# Patient Record
Sex: Female | Born: 1990 | Race: Black or African American | Hispanic: No | Marital: Single | Smoking: Never smoker
Health system: Southern US, Community
[De-identification: ages and names within clinical notes are randomized; demographics above are authoritative.]

## PROBLEM LIST (undated history)

## (undated) DIAGNOSIS — F32A Depression, unspecified: Secondary | ICD-10-CM

## (undated) DIAGNOSIS — G5600 Carpal tunnel syndrome, unspecified upper limb: Secondary | ICD-10-CM

## (undated) DIAGNOSIS — F329 Major depressive disorder, single episode, unspecified: Secondary | ICD-10-CM

---
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
## 2012-03-02 ENCOUNTER — Emergency Department (HOSPITAL_COMMUNITY)
Admission: EM | Admit: 2012-03-02 | Discharge: 2012-03-02 | Disposition: A | Discharge status: Home / Self Care | Payer: Self-pay | Attending: Emergency Medicine | Admitting: Emergency Medicine

## 2012-03-02 ENCOUNTER — Encounter (HOSPITAL_COMMUNITY): Payer: Self-pay | Admitting: Nurse Practitioner

## 2012-03-02 DIAGNOSIS — M79609 Pain in unspecified limb: Secondary | ICD-10-CM | POA: Insufficient documentation

## 2012-03-02 DIAGNOSIS — M79641 Pain in right hand: Secondary | ICD-10-CM

## 2012-03-02 DIAGNOSIS — M25549 Pain in joints of unspecified hand: Secondary | ICD-10-CM | POA: Insufficient documentation

## 2012-03-02 HISTORY — DX: Carpal tunnel syndrome, unspecified upper limb: G56.00

## 2012-03-02 MED ORDER — HYDROCODONE-ACETAMINOPHEN 5-500 MG PO TABS: 1.0000 | ORAL_TABLET | Freq: Four times a day (QID) | ORAL | Status: AC | PRN

## 2012-03-02 MED ORDER — PREDNISONE 20 MG PO TABS: 40.0000 mg | ORAL_TABLET | Freq: Every day | ORAL | Status: DC

## 2012-03-02 NOTE — ED Provider Notes (Signed)
Medical screening examination/treatment/procedure(s) were performed by non-physician practitioner and as supervising physician I was immediately available for consultation/collaboration.   Dakiya Puopolo, MD 03/02/12 1707 

## 2012-03-02 NOTE — ED Notes (Signed)
Pt c/o "shooting pains" from hands up arms over past 3 weeks. Seen at [facility] and dx with carpal tunnel started on naproxen with no pain relief. States she cant even work due to pain. Cms intact

## 2012-03-02 NOTE — ED Provider Notes (Signed)
History     CSN: [id number]  Arrival date & time 03/02/12  7829   First MD Initiated Contact with Patient 03/02/12 0914      Chief Complaint  Patient presents with  . Extremity Pain    (Consider location/radiation/quality/duration/timing/severity/associated sxs/prior treatment) Patient is a 21 y.o. female presenting with extremity pain. The history is provided by the patient and a parent.  Extremity Pain This is a new problem. The current episode started 1 to 4 weeks ago. The problem occurs constantly. The problem has been gradually worsening. Associated symptoms include arthralgias and numbness. Pertinent negatives include no chills, fever, joint swelling, neck pain, rash or weakness. The symptoms are aggravated by bending. She has tried NSAIDs and immobilization for the symptoms. The treatment provided no relief.  Pt reports onset of right hand pain about 3 weeks ago, at first only at night, waking her up from sleep, but now throughout the day too. States pain mainly in palm of the right hand, radiating into right middle finger and up the arm into right shoulder. States also has mild pain in the left hand, as well, but nothing as bad. Denies swelling of the hand of wrist. Denies redness. Denies injury. Denies fever, chills, malaise. Denies neck pain or stiffness. Denies weakness in hands.  Past Medical History  Diagnosis Date  . Carpal tunnel syndrome     History reviewed. No pertinent past surgical history.  History reviewed. No pertinent family history.  History  Substance Use Topics  . Smoking status: Never Smoker   . Smokeless tobacco: Not on file  . Alcohol Use: No    OB History    Grav Para Term Preterm Abortions TAB SAB Ect Mult Living                  Review of Systems  Constitutional: Negative for fever and chills.  HENT: Negative for neck pain.   Eyes: Negative.   Respiratory: Negative.   Cardiovascular: Negative.   Gastrointestinal: Negative.     Genitourinary: Negative.   Musculoskeletal: Positive for arthralgias. Negative for joint swelling.  Skin: Negative for rash.  Neurological: Positive for numbness. Negative for weakness.  Psychiatric/Behavioral: Negative.     Allergies  Review of patient's allergies indicates no known allergies.  Home Medications  No current outpatient prescriptions on file.  BP 129/85  Pulse 82  Temp(Src) 97.5 F (36.4 C) (Oral)  Resp 18  Ht 5\' 6"  (1.676 m)  Wt 280 lb (127.007 kg)  BMI 45.19 kg/m2  SpO2 99%  Physical Exam  Nursing note and vitals reviewed. Constitutional: She is oriented to person, place, and time. She appears well-developed and well-nourished. No distress.  HENT:  Head: Normocephalic.  Eyes: Conjunctivae are normal.  Neck: Normal range of motion. Neck supple.  Cardiovascular: Normal rate, regular rhythm and normal heart sounds.   Pulmonary/Chest: Effort normal and breath sounds normal.  Musculoskeletal: She exhibits tenderness. She exhibits no edema.       Full ROM of the neck, no tenderness with palpation of midline c-spine. Normal appearing right shoulder, right elbow with full rom. Normal appearing right hand. Tender with palpation over anterior right middle finger, and over 2nd and 3rd palmar surfaces of the MCP joints. Pain with Wrist flexion, and with flexion of all finger. Full ROM of all fingers, no tenderness directly over flexor tendons. Good strength with flexion and extension of all fingers against resistance. Good right radial and ulnar pulses  Neurological: She is alert and oriented  to person, place, and time.  Skin: Skin is warm and dry.  Psychiatric: She has a normal mood and affect.    ED Course  Procedures (including critical care time)  Suspect neuropathic pain in right hand. Pt already seen at [facility] and diagnosed with carpal tunnel, pt has a splint, taking naprosyn, states pain not controlled. Unable to go to work today. suspct pt's pain either due to  carpal tunnel or other source median/radial nerve impingement. No weakness noted on todays exam. Will continue splint. Refer to hand  No diagnosis found.    MDM         Lottie Mussel, PA 03/02/12 [phone]

## 2012-03-02 NOTE — Discharge Instructions (Signed)
I suspect your pain is caused by an impingement of one of the nerves in your hand. It is important that if this pain continues, you follow up with a hand specialist who can do more tests and treat your symptoms. For now, continue wearing the brace, day and night. No lifting with right hand. Continue naprosyn. Take prednisone for 5 days as prescribed. Take vicodin as prescribed as needed for severe pain only, do not drive if taking this medication.   Carpal Tunnel Syndrome The carpal tunnel is a narrow hollow area in the wrist. It is formed by the wrist bones and ligaments. Nerves, blood vessels, and tendons (cord like structures which attach muscle to bone) on the palm side (the side of your hand in the direction your fingers bend) of your hand pass through the carpal tunnel. Repeated wrist motion or certain diseases may cause swelling within the tunnel. (That is why these are called repetitive trauma (damage caused by over use) disorders. It is also a common problem in late pregnancy.) This swelling pinches the main nerve in the wrist (median nerve) and causes the painful condition called carpal tunnel syndrome. A feeling of "pins and needles" may be noticed in the fingers or hand; however, the entire arm may ache from this condition. Carpal tunnel syndrome may clear up by itself. Cortisone injections may help. Sometimes, an operation may be needed to free the pinched nerve. An electromyogram (a type of test) may be needed to confirm this diagnosis (learning what is wrong). This is a test which measures nerve conduction. The nerve conduction is usually slowed in a carpal tunnel syndrome. HOME CARE INSTRUCTIONS   If your caregiver prescribed medication to help reduce swelling, take as directed.   If you were given a splint to keep your wrist from bending, use it as instructed. It is important to wear the splint at night. Use the splint for as long as you have pain or numbness in your hand, arm or wrist. This  may take 1 to 2 months.   If you have pain at night, it may help to rub or shake your hand, or elevate your hand above the level of your heart (the center of your chest).   It is important to give your wrist a rest by stopping the activities that are causing the problem. If your symptoms (problems) are work-related, you may need to talk to your employer about changing to a job that does not require using your wrist.   Only take over-the-counter or prescription medicines for pain, discomfort, or fever as directed by your caregiver.   Following periods of extended use, particularly strenuous use, apply an ice pack wrapped in a towel to the anterior (palm) side of the affected wrist for 20 to 30 minutes. Repeat as needed three to four times per day. This will help reduce the swelling.   Follow all instructions for follow-up with your caregiver. This includes any orthopedic referrals, physical therapy, and rehabilitation. Any delay in obtaining necessary care could result in a delay or failure of your condition to heal.  SEEK IMMEDIATE MEDICAL CARE IF:   You are still having pain and numbness following a week of treatment.   You develop new, unexplained symptoms.   Your current symptoms are getting worse and are not helped or controlled with medications.  MAKE SURE YOU:   Understand these instructions.   Will watch your condition.   Will get help right away if you are not doing well  or get worse.  Document Released: 12/03/2000 Document Revised: 11/25/2011 Document Reviewed: 10/22/2011 [facility] Patient Information 2012 [place], [place].

## 2012-03-02 NOTE — ED Notes (Signed)
Pt given note for work, works at [employer] is [employer].

## 2015-03-19 ENCOUNTER — Encounter (HOSPITAL_BASED_OUTPATIENT_CLINIC_OR_DEPARTMENT_OTHER): Payer: Self-pay

## 2015-03-19 ENCOUNTER — Emergency Department (HOSPITAL_BASED_OUTPATIENT_CLINIC_OR_DEPARTMENT_OTHER)
Admission: EM | Admit: 2015-03-19 | Discharge: 2015-03-19 | Disposition: A | Discharge status: Home / Self Care | Payer: Self-pay | Attending: Emergency Medicine | Admitting: Emergency Medicine

## 2015-03-19 ENCOUNTER — Other Ambulatory Visit: Payer: Self-pay

## 2015-03-19 DIAGNOSIS — R42 Dizziness and giddiness: Secondary | ICD-10-CM | POA: Insufficient documentation

## 2015-03-19 DIAGNOSIS — Z8669 Personal history of other diseases of the nervous system and sense organs: Secondary | ICD-10-CM | POA: Insufficient documentation

## 2015-03-19 LAB — BASIC METABOLIC PANEL
Anion gap: 8 (ref 5–15)
BUN: 14 mg/dL (ref 6–23)
CHLORIDE: 106 mmol/L (ref 96–112)
CO2: 25 mmol/L (ref 19–32)
CREATININE: 0.88 mg/dL (ref 0.50–1.10)
Calcium: 9.3 mg/dL (ref 8.4–10.5)
GFR calc Af Amer: >90 mL/min (ref >90)
GLUCOSE: 109 mg/dL — AB (ref 70–99)
POTASSIUM: 3.8 mmol/L (ref 3.5–5.1)
SODIUM: 139 mmol/L (ref 135–145)

## 2015-03-19 LAB — CBC
HEMATOCRIT: 37.6 % (ref 36.0–46.0)
HEMOGLOBIN: 11.8 g/dL — AB (ref 12.0–15.0)
MCH: 23.3 pg — ABNORMAL LOW (ref 26.0–34.0)
MCHC: 31.4 g/dL (ref 30.0–36.0)
MCV: 74.3 fL — ABNORMAL LOW (ref 78.0–100.0)
Platelets: 376 10*3/uL (ref 150–400)
RBC: 5.06 MIL/uL (ref 3.87–5.11)
RDW: 18.4 % — ABNORMAL HIGH (ref 11.5–15.5)
WBC: 9.2 10*3/uL (ref 4.0–10.5)

## 2015-03-19 MED ORDER — IBUPROFEN 800 MG PO TABS
800.0000 mg | ORAL_TABLET | Freq: Once | ORAL | Status: AC
  Administered 2015-03-19: 800 mg via ORAL
  Filled 2015-03-19: qty 1

## 2015-03-19 MED ORDER — MECLIZINE HCL 12.5 MG PO TABS: 12.5000 mg | ORAL_TABLET | Freq: Three times a day (TID) | ORAL | Status: AC | PRN

## 2015-03-19 NOTE — Discharge Instructions (Signed)
Benign Positional Vertigo °Vertigo means you feel like you or your surroundings are moving when they are not. Benign positional vertigo is the most common form of vertigo. Benign means that the cause of your condition is not serious. Benign positional vertigo is more common in older adults. °CAUSES  °Benign positional vertigo is the result of an upset in the labyrinth system. This is an area in the middle ear that helps control your balance. This may be caused by a viral infection, head injury, or repetitive motion. However, often no specific cause is found. °SYMPTOMS  °Symptoms of benign positional vertigo occur when you move your head or eyes in different directions. Some of the symptoms may include: °· Loss of balance and falls. °· Vomiting. °· Blurred vision. °· Dizziness. °· Nausea. °· Involuntary eye movements (nystagmus). °DIAGNOSIS  °Benign positional vertigo is usually diagnosed by physical exam. If the specific cause of your benign positional vertigo is unknown, your caregiver may perform imaging tests, such as magnetic resonance imaging (MRI) or computed tomography (CT). °TREATMENT  °Your caregiver may recommend movements or procedures to correct the benign positional vertigo. Medicines such as meclizine, benzodiazepines, and medicines for nausea may be used to treat your symptoms. In rare cases, if your symptoms are caused by certain conditions that affect the inner ear, you may need surgery. °HOME CARE INSTRUCTIONS  °· Follow your caregiver's instructions. °· Move slowly. Do not make sudden body or head movements. °· Avoid driving. °· Avoid operating heavy machinery. °· Avoid performing any tasks that would be dangerous to you or others during a vertigo episode. °· Drink enough fluids to keep your urine clear or pale yellow. °SEEK IMMEDIATE MEDICAL CARE IF:  °· You develop problems with walking, weakness, numbness, or using your arms, hands, or legs. °· You have difficulty speaking. °· You develop  severe headaches. °· Your nausea or vomiting continues or gets worse. °· You develop visual changes. °· Your family or friends notice any behavioral changes. °· Your condition gets worse. °· You have a fever. °· You develop a stiff neck or sensitivity to light. °MAKE SURE YOU:  °· Understand these instructions. °· Will watch your condition. °· Will get help right away if you are not doing well or get worse. °Document Released: 09/13/2006 Document Revised: 02/28/2012 Document Reviewed: 08/26/2011 °ExitCare® Patient Information ©2015 [employer]. This information is not intended to replace advice given to you by your health care provider. Make sure you discuss any questions you have with your health care provider. ° °Dizziness °Dizziness is a common problem. It is a feeling of unsteadiness or light-headedness. You may feel like you are about to faint. Dizziness can lead to injury if you stumble or fall. A person of any age group can suffer from dizziness, but dizziness is more common in older adults. °CAUSES  °Dizziness can be caused by many different things, including: °· Middle ear problems. °· Standing for too long. °· Infections. °· An allergic reaction. °· Aging. °· An emotional response to something, such as the sight of blood. °· Side effects of medicines. °· Tiredness. °· Problems with circulation or blood pressure. °· Excessive use of alcohol or medicines, or illegal drug use. °· Breathing too fast (hyperventilation). °· An irregular heart rhythm (arrhythmia). °· A low red blood cell count (anemia). °· Pregnancy. °· Vomiting, diarrhea, fever, or other illnesses that cause body fluid loss (dehydration). °· Diseases or conditions such as Parkinson's disease, high blood pressure (hypertension), diabetes, and thyroid problems. °·   Exposure to extreme heat. DIAGNOSIS  Your health care provider will ask about your symptoms, perform a physical exam, and perform an electrocardiogram (ECG) to record the electrical  activity of your heart. Your health care provider may also perform other heart or blood tests to determine the cause of your dizziness. These may include:  Transthoracic echocardiogram (TTE). During echocardiography, sound waves are used to evaluate how blood flows through your heart.  Transesophageal echocardiogram (TEE).  Cardiac monitoring. This allows your health care provider to monitor your heart rate and rhythm in real time.  Holter monitor. This is a portable device that records your heartbeat and can help diagnose heart arrhythmias. It allows your health care provider to track your heart activity for several days if needed.  Stress tests by exercise or by giving medicine that makes the heart beat faster. TREATMENT  Treatment of dizziness depends on the cause of your symptoms and can vary greatly. HOME CARE INSTRUCTIONS   Drink enough fluids to keep your urine clear or pale yellow. This is especially important in very hot weather. In older adults, it is also important in cold weather.  Take your medicine exactly as directed if your dizziness is caused by medicines. When taking blood pressure medicines, it is especially important to get up slowly.  Rise slowly from chairs and steady yourself until you feel okay.  In the morning, first sit up on the side of the bed. When you feel okay, stand slowly while holding onto something until you know your balance is fine.  Move your legs often if you need to stand in one place for a long time. Tighten and relax your muscles in your legs while standing.  Have someone stay with you for 1-2 days if dizziness continues to be a problem. Do this until you feel you are well enough to stay alone. Have the person call your health care provider if he or she notices changes in you that are concerning.  Do not drive or use heavy machinery if you feel dizzy.  Do not drink alcohol. SEEK IMMEDIATE MEDICAL CARE IF:   Your dizziness or light-headedness  gets worse.  You feel nauseous or vomit.  You have problems talking, walking, or using your arms, hands, or legs.  You feel weak.  You are not thinking clearly or you have trouble forming sentences. It may take a friend or family member to notice this.  You have chest pain, abdominal pain, shortness of breath, or sweating.  Your vision changes.  You notice any bleeding.  You have side effects from medicine that seems to be getting worse rather than better. MAKE SURE YOU:   Understand these instructions.  Will watch your condition.  Will get help right away if you are not doing well or get worse. Document Released: 06/01/2001 Document Revised: 12/11/2013 Document Reviewed: 06/25/2011 [facility] Patient Information 2015 [place], [place]. This information is not intended to replace advice given to you by your health care provider. Make sure you discuss any questions you have with your health care provider.  You were evaluated in the ED today for your dizziness. There is not appear to be an emergent cause. Symptoms at this time. It is important for you to take your medications as prescribed. It is also important for you to follow-up with primary care for further evaluation and management of your symptoms. Return to ED for new or worsening symptoms.   Emergency Department Resource Guide 1) Find a Doctor and Pay Out of  Pocket Although you won't have to find out who is covered by your insurance plan, it is a good idea to ask around and get recommendations. You will then need to call the office and see if the doctor you have chosen will accept you as a new patient and what types of options they offer for patients who are self-pay. Some doctors offer discounts or will set up payment plans for their patients who do not have insurance, but you will need to ask so you aren't surprised when you get to your appointment.  2) Contact Your Local Health Department Not all health departments have  doctors that can see patients for sick visits, but many do, so it is worth a call to see if yours does. If you don't know where your local health department is, you can check in your phone book. The CDC also has a tool to help you locate your state's health department, and many state websites also have listings of all of their local health departments.  3) Find a Walk-in Clinic If your illness is not likely to be very severe or complicated, you may want to try a walk in clinic. These are popping up all over the country in pharmacies, drugstores, and shopping centers. They're usually staffed by nurse practitioners or physician assistants that have been trained to treat common illnesses and complaints. They're usually fairly quick and inexpensive. However, if you have serious medical issues or chronic medical problems, these are probably not your best option.  No Primary Care Doctor: - Call Health Connect at  [phone] - they can help you locate a primary care doctor that  accepts your insurance, provides certain services, etc. - Physician Referral Service- [phone]  Chronic Pain Problems: Organization         Address  Phone   Notes  [facility]  [phone] Patients need to be referred by their primary care doctor.   Medication Assistance: Organization         Address  Phone   Notes  [facility] Medication [facility] [address] [place] [place], [place] [address] [phone] --Must be a resident of [facility] -- Must have NO insurance coverage whatsoever (no Medicaid/ Medicare, etc.) -- The pt. MUST have a primary care doctor that directs their care regularly and follows them in the community   [facility]ist  [phone]   [employer]  [phone]    Agencies that provide inexpensive medical care: Organization         Address  Phone   Notes  [facility]  [phone]   [facility]    [phone]    [facility] [address] [place], [place] [address] [phone]   [facility] [address], [place] [phone]   Planned Parenthood    [phone]   [facility]    [phone]   [facility]  [address], [place] Phone:  [phone], Fax:  [phone] Hours of Operation:  9 am - 6 pm, M-F.  Also accepts Medicaid/Medicare and self-pay.  [facility]  [address], [place] Phone: [phone], Fax: [phone]. Hours of Operation:  8:30 am - 5:30 pm, M-F.  Also accepts Medicaid and self-pay.  [facility] [place] [address], [employer] Phone: [phone]   [facility] [address] [place], [place] [phone], Ext. [phone] Mondays & Thursdays: 7-9 AM.  First 15 patients are seen on a first come, first serve basis.    Medicaid-accepting [facility] Providers:  Organization         Address  Phone   Notes  [facility] [address], [place] [phone] Also accepts self-pay patients.  [facility] [address] Laure[place] Josephs [place], [place]  [phone]   [facility] [address], [place] [phone]   [facility] [address] [place] [phone]   Renaye Rakers [address], [place]   [phone] Only accepts [place] patients after they have their name applied to their card.   Self-Pay (no insurance) in [place] [facility]:  Organization         Address  Phone   Notes  Sickle Cell Patients, [facility] Internal Medicine [address] [place], [place] [phone]   [facility] [address] [place], [place] [phone]   [facility] [place]  [address], [place] [phone]   [facility]/Dr. Osei-Bonsu  [address], [place] or [address], [place] [phone] Phone number for both [place] locations is the same.  [facility] and [facility] [address] [place] [phone]   [facility] [address], [place] or [address] [phone]   [facility] [address] [place] [phone], phone; [phone], fax Sees patients 1st and 3rd Saturday of every month.  Must not qualify for public or private insurance (i.e. Medicaid, Medicare, [place] Health Choice, Veterans' Benefits)  Household income should be no more than 200% of the poverty level The clinic cannot treat you if you are pregnant or think you are pregnant  Sexually transmitted diseases are not treated at the clinic.    Dental Care: Organization         Address  Phone  Notes  [facility] [address] [place], [place] [phone] Accepts children up to age 47 who are enrolled in [place] or [place] Health Choice; pregnant women with a Medicaid card; and children who have applied for Medicaid or [place] Health Choice, but were declined, whose parents can pay a reduced fee at time of service.  [facility] of [facility]  [address], [place] [phone] Accepts children up to age 42 who are enrolled in [place] or [place] Health Choice; pregnant women with a Medicaid card; and children who have applied for Medicaid or [place] Health Choice, but were declined, whose parents can pay a reduced fee at time of service.  [facility]  [address] [place], [place] [phone] Patients are seen by appointment only. Walk-ins are not accepted. [facility] will see patients 70 years of age and older. Monday - Tuesday (8am-5pm) Most Wednesdays (8:30-5pm) $30 per visit, cash only  [facility]  Adult Dental Access PROGRAM  [address], [facility] [phone] Patients are seen by appointment only. Walk-ins are not accepted. [facility] will see patients 46 years of age and older. One Wednesday Evening (Monthly: Volunteer Based).  $30 per visit, cash only  [employer] of Dentistry Clinics  [phone] for adults; Children under age 77, call Graduate Pediatric Dentistry at [phone]. Children aged 57-14, please call [phone] to request a pediatric application.  Dental services are provided in all areas of dental care including fillings, crowns and bridges, complete and partial dentures, implants, gum treatment, root canals, and extractions. Preventive care is also provided. Treatment is provided to both adults and children. Patients are selected via a lottery and there is often a waiting list.   [facility] [address] [place]  [phone] [email or web address]   [facility] [address] [place], [place] [phone], Ext. [phone] Second and Fourth Thursday of each month, opens at 6:30 AM; Clinic ends at 9 AM.  Patients are seen on a first-come first-served basis, and a limited number are seen during each clinic.   [facility]  [address] [place], [place] [phone]   Eligibility Requirements You must have lived in [place], [place], or [place] counties for at least the last three months.   You cannot be eligible for state or federal sponsored [place], including [employer], [place], or [employer].   You generally cannot be eligible for healthcare insurance through your employer.    How to apply: Eligibility screenings are held every Tuesday and Wednesday afternoon from 1:00 pm until 4:00 pm. You do not need an appointment for the interview!  [facility] [address] [place], [place] [phone]   [facility] Health Department  [phone]   [facility] Health Department  [phone]   [facility] Health Department  [phone]    Behavioral Health Resources in the Community: Intensive Outpatient Programs Organization         Address  Phone  Notes  [facility] Services [address] [place], [place] [phone]   [facility] Outpatient [address], [place], [place] [phone]   [facility] [address] [place], [place]  [phone]   [facility] Mental Health [address],  [place], [place] [phone] or [phone]   Substance Abuse Resources Organization         Address  Phone  Notes  [facility] Drug [facility]  [phone]   [facility]  [phone]   [facility] [place]  [phone]   Floydene Flock  [phone]   Residential & Outpatient Substance Abuse Program  [phone]   Psychological Services Organization         Address  Phone  Notes  [facility] Behavioral Health  [phone]   [facility]  [phone]   [facility] Mental Health [address], [place] [phone] or [phone]    Mobile Crisis Teams Organization         Address  Phone  Notes  [facility], Mobile Crisis Care Unit  [phone]   [facility]  [address] [place], [place] [phone]   Doristine Locks [address] [place] [phone]    Self-Help/Support Groups Organization         Address  Phone             Notes  [facility] [place] - variety of support groups  [phone]- I7437963 Call for more information  Narcotics Anonymous (NA), [facility] [address], [employer] [place]  2 meetings at this location   Statistician  Address  Phone  Notes  [facility] [address],    [place]  [phone]   [facility]  [address] [place] [phone], [place], [place] [phone]   [facility] [address], [place] [phone] Admissions: 8am-3pm M-F  [facility] [address]    [place], [place] [phone]   [facility] [address], [place], [place] [phone]   [facility] [address]  [place], [place] [phone]   [facility] - Intensive Outpatient [address], [place], [place] [phone]   [facility] [address]  [place], [place] [phone] or [phone]   [facility] ([facility]) [address] [place], [place] [phone] Accepts Medicaid  [facility] [address]  [place] [phone] Substance Abuse/Addiction Treatment   [facility] Behavioral Health Resources Organization         Address  Phone  Notes  [facility]  [phone]) [phone]   Angie FavaJulie Brannon, PhD [address] [place], [place]   [phone] or [phone](336) [phone]   [facility]   [address] [place], [place] [phone](336) [phone]   [facility] [address], [place], [place] [phone] Insurance/Medicaid/sponsorship through [facility]  [facility] Families [address]                                    [place], [place] [phone]) [phone] Therapy/tele-psych/case  [facility] [address]   [place], [place] [phone](336) [phone]    Dr. Lolly MustacheArfeen  [phone]   [facility]  [employer] [facility] 1) [address], [place] 2) [address], [place] 3)  [address], [place] [phone]) [phone]) [phone]  [phone](336) [phone]   [facility] Child Abuse Hotline [phone]) [phone] or [phone]) [phone] (After Hours)

## 2015-03-19 NOTE — ED Notes (Signed)
PA at bedside.

## 2015-03-19 NOTE — ED Provider Notes (Signed)
CSN: [id number]     Arrival date & time 03/19/15  1550 History   First MD Initiated Contact with Patient 03/19/15 1600     Chief Complaint  Patient presents with  . Dizziness     (Consider location/radiation/quality/duration/timing/severity/associated sxs/prior Treatment) HPI Kim James is a 24 y.o. female who comes in for evaluation of dizziness. Patient states on Monday she began taking the "Hydroxycut" supplement without eating or drinking water and began to experience sudden dizziness that she characterizes as "the room spinning around". She reports discontinuing this supplement with the dizziness has persisted. She reports it comes on when she moves her head to the right and lays down. She reports it is relieved when she sits up or stands up. She does report a throbbing right-sided headache sometimes during these dizzy spells. She has not taken anything to try to improve her symptoms. She does report that she only drinks 2 glasses of water per day "if that much". She denies having any water today. Denies fevers, nausea or vomiting, abdominal pain, syncope, chest pain, shortness of breath, palpitations, urinary symptoms, rash. Last menstrual period was March 15 and was normal for her. No grossly bloody or dark stools.   Past Medical History  Diagnosis Date  . Carpal tunnel syndrome    History reviewed. No pertinent past surgical history. No family history on file. History  Substance Use Topics  . Smoking status: Never Smoker   . Smokeless tobacco: Not on file  . Alcohol Use: No   OB History    No data available     Review of Systems A 10 point review of systems was completed and was negative except for pertinent positives and negatives as mentioned in the history of present illness     Allergies  Other  Home Medications   Prior to Admission medications   Medication Sig Start Date End Date Taking? Authorizing Provider  UNABLE TO FIND "hydroxycut"   Yes Historical Provider,  MD  meclizine (ANTIVERT) 12.5 MG tablet Take 1 tablet (12.5 mg total) by mouth 3 (three) times daily as needed for dizziness. 03/19/15   Zarielle Cea, PA-C   BP 119/53 mmHg  Pulse 96  Temp(Src) 98.6 F (37 C) (Oral)  Resp 16  Ht  (1.676 m)  Wt 275 lb (124.739 kg)  BMI 44.41 kg/m2  SpO2 98%  LMP 03/04/2015 Physical Exam  Constitutional: She is oriented to person, place, and time. She appears well-developed and well-nourished.  HENT:  Head: Normocephalic and atraumatic.  Mouth/Throat: Oropharynx is clear and moist.  Eyes: Conjunctivae are normal. Pupils are equal, round, and reactive to light. Right eye exhibits no discharge. Left eye exhibits no discharge. No scleral icterus.  Neck: Neck supple.  Cardiovascular: Normal rate, regular rhythm and normal heart sounds.   Pulmonary/Chest: Effort normal and breath sounds normal. No respiratory distress. She has no wheezes. She has no rales.  Abdominal: Soft. There is no tenderness.  Musculoskeletal: She exhibits no tenderness.  Neurological: She is alert and oriented to person, place, and time.  Cranial Nerves II-XII grossly intact. Motor and sensation 5/5 in all 4 extremities. Extraocular movements intact without nystagmus. Completes heel to shin and finger to nose coordination movements without difficulty. Gait baseline without ataxia. Spinning sensation is reproduced when head is laying back and turn to the right.  Skin: Skin is warm and dry. No rash noted.  Psychiatric: She has a normal mood and affect.  Nursing note and vitals reviewed.   ED  Course  Procedures (including critical care time) Labs Review Labs Reviewed  BASIC METABOLIC PANEL - Abnormal; Notable for the following:    Glucose, Bld 109 (*)    All other components within normal limits  CBC - Abnormal; Notable for the following:    Hemoglobin 11.8 (*)    MCV 74.3 (*)    MCH 23.3 (*)    RDW 18.4 (*)    All other components within normal limits    Imaging  Review No results found.   EKG Interpretation None     Meds given in ED:  Medications  ibuprofen (ADVIL,MOTRIN) tablet 800 mg (not administered)    New Prescriptions   MECLIZINE (ANTIVERT) 12.5 MG TABLET    Take 1 tablet (12.5 mg total) by mouth 3 (three) times daily as needed for dizziness.   Filed Vitals:   03/19/15 1605 03/19/15 1814  BP: 123/61 119/53  Pulse: 98 96  Temp: 98.6 F (37 C)   TempSrc: Oral   Resp: 18 16  Height: 5\' 6"  (1.676 m)   Weight: 275 lb (124.739 kg)   SpO2: 96% 98%    MDM  Vitals stable - WNL -afebrile Pt resting comfortably in ED. PE-dizziness replicated with head extension and rotation to the right. Resolves with head positioning return to rest. Benign neuro exam Labwork-essentially noncontributory. Hemoglobin is 11.8. EKG not concerning.  DDX--patient with likely benign postural vertigo. Doubt any central cause for patient's dizziness. Will DC with meclizine for any future discomfort. Discussed further symptomatic support with appropriate fluid hydration and avoidance of dietary supplements. Discussed follow-up with primary care for further evaluation and management of your symptoms. I discussed all relevant lab findings and imaging results with pt and they verbalized understanding. Discussed f/u with PCP within 48 hrs and return precautions, pt very amenable to plan.  Final diagnoses:  Dizziness       Joycie PeekBenjamin Anntonette Madewell, PA-C 03/19/15 1840  Geoffery Lyonsouglas Delo, MD 03/19/15 [phone]

## 2015-03-19 NOTE — ED Notes (Signed)
C/o dizziness x 3 days-worse when tilts head-HA to right temple x today

## 2015-03-19 NOTE — ED Notes (Addendum)
PA at bedside.

## 2015-03-19 NOTE — ED Notes (Signed)
Pt reports onset of dizziness Monday around 2pm- States she had taken hydroxycut and had not eaten- today pt reports the dizziness has persisted since Monday, she has a right side headache, and is no longer taking the hydroxcut sopplement

## 2019-09-09 ENCOUNTER — Emergency Department (HOSPITAL_BASED_OUTPATIENT_CLINIC_OR_DEPARTMENT_OTHER)
Admission: EM | Admit: 2019-09-09 | Discharge: 2019-09-09 | Disposition: A | Discharge status: Home / Self Care | Payer: Self-pay | Attending: Emergency Medicine | Admitting: Emergency Medicine

## 2019-09-09 ENCOUNTER — Other Ambulatory Visit: Payer: Self-pay

## 2019-09-09 ENCOUNTER — Emergency Department (HOSPITAL_BASED_OUTPATIENT_CLINIC_OR_DEPARTMENT_OTHER): Payer: Self-pay

## 2019-09-09 ENCOUNTER — Encounter (HOSPITAL_BASED_OUTPATIENT_CLINIC_OR_DEPARTMENT_OTHER): Payer: Self-pay | Admitting: *Deleted

## 2019-09-09 DIAGNOSIS — R519 Headache, unspecified: Secondary | ICD-10-CM

## 2019-09-09 DIAGNOSIS — B349 Viral infection, unspecified: Secondary | ICD-10-CM | POA: Insufficient documentation

## 2019-09-09 DIAGNOSIS — R51 Headache: Secondary | ICD-10-CM | POA: Insufficient documentation

## 2019-09-09 DIAGNOSIS — Z20828 Contact with and (suspected) exposure to other viral communicable diseases: Secondary | ICD-10-CM | POA: Insufficient documentation

## 2019-09-09 DIAGNOSIS — Z3202 Encounter for pregnancy test, result negative: Secondary | ICD-10-CM | POA: Insufficient documentation

## 2019-09-09 HISTORY — DX: Depression, unspecified: F32.A

## 2019-09-09 LAB — COMPREHENSIVE METABOLIC PANEL
ALT: 22 U/L (ref 0–44)
AST: 21 U/L (ref 15–41)
Albumin: 3.7 g/dL (ref 3.5–5.0)
Alkaline Phosphatase: 74 U/L (ref 38–126)
Anion gap: 12 (ref 5–15)
BUN: 8 mg/dL (ref 6–20)
CO2: 25 mmol/L (ref 22–32)
Calcium: 9.1 mg/dL (ref 8.9–10.3)
Chloride: 97 mmol/L — ABNORMAL LOW (ref 98–111)
Creatinine, Ser: 0.77 mg/dL (ref 0.44–1.00)
GFR calc Af Amer: >60 mL/min (ref >60)
GFR calc non Af Amer: >60 mL/min (ref >60)
Glucose, Bld: 140 mg/dL — ABNORMAL HIGH (ref 70–99)
Potassium: 3.8 mmol/L (ref 3.5–5.1)
Sodium: 134 mmol/L — ABNORMAL LOW (ref 135–145)
Total Bilirubin: 0.4 mg/dL (ref 0.3–1.2)
Total Protein: 8.4 g/dL — ABNORMAL HIGH (ref 6.5–8.1)

## 2019-09-09 LAB — URINALYSIS, ROUTINE W REFLEX MICROSCOPIC
Bilirubin Urine: NEGATIVE
Glucose, UA: NEGATIVE mg/dL
Hgb urine dipstick: NEGATIVE
Ketones, ur: 40 mg/dL — AB
Leukocytes,Ua: NEGATIVE
Nitrite: NEGATIVE
Protein, ur: NEGATIVE mg/dL
Specific Gravity, Urine: 1.02 (ref 1.005–1.030)
pH: 8 (ref 5.0–8.0)

## 2019-09-09 LAB — CBC WITH DIFFERENTIAL/PLATELET
Abs Immature Granulocytes: 0.02 10*3/uL (ref 0.00–0.07)
Basophils Absolute: 0 10*3/uL (ref 0.0–0.1)
Basophils Relative: 0 %
Eosinophils Absolute: 0.1 10*3/uL (ref 0.0–0.5)
Eosinophils Relative: 1 %
HCT: 39.7 % (ref 36.0–46.0)
Hemoglobin: 12.4 g/dL (ref 12.0–15.0)
Immature Granulocytes: 0 %
Lymphocytes Relative: 16 %
Lymphs Abs: 1.3 10*3/uL (ref 0.7–4.0)
MCH: 26.6 pg (ref 26.0–34.0)
MCHC: 31.2 g/dL (ref 30.0–36.0)
MCV: 85.2 fL (ref 80.0–100.0)
Monocytes Absolute: 0.3 10*3/uL (ref 0.1–1.0)
Monocytes Relative: 3 %
Neutro Abs: 6.5 10*3/uL (ref 1.7–7.7)
Neutrophils Relative %: 80 %
Platelets: 381 10*3/uL (ref 150–400)
RBC: 4.66 MIL/uL (ref 3.87–5.11)
RDW: 13.8 % (ref 11.5–15.5)
WBC: 8.2 10*3/uL (ref 4.0–10.5)
nRBC: 0 % (ref 0.0–0.2)

## 2019-09-09 LAB — PREGNANCY, URINE: Preg Test, Ur: NEGATIVE

## 2019-09-09 MED ORDER — DIPHENHYDRAMINE HCL 50 MG/ML IJ SOLN
25.0000 mg | Freq: Once | INTRAMUSCULAR | Status: AC
  Administered 2019-09-09: 21:00:00 25 mg via INTRAVENOUS
  Filled 2019-09-09: qty 1

## 2019-09-09 MED ORDER — METOCLOPRAMIDE HCL 5 MG/ML IJ SOLN
10.0000 mg | Freq: Once | INTRAMUSCULAR | Status: AC
  Administered 2019-09-09: 10 mg via INTRAVENOUS
  Filled 2019-09-09: qty 2

## 2019-09-09 MED ORDER — FENTANYL CITRATE (PF) 100 MCG/2ML IJ SOLN
100.0000 ug | Freq: Once | INTRAMUSCULAR | Status: AC
  Administered 2019-09-09: 100 ug via INTRAVENOUS
  Filled 2019-09-09: qty 2

## 2019-09-09 MED ORDER — SODIUM CHLORIDE 0.9 % IV BOLUS
1000.0000 mL | Freq: Once | INTRAVENOUS | Status: AC
  Administered 2019-09-09: 1000 mL via INTRAVENOUS

## 2019-09-09 NOTE — Discharge Instructions (Addendum)
Rest. Drink plenty of fluids.  Follow up with primary care doctor in the coming week.  Return to ER if worse, new symptoms, worsening or severe pain, persistent vomiting, trouble breathing, or other concern.  Your covid test is pending - results should be back tomorrow, you can call them to get results.       Please read and follow all provided instructions.  Your diagnoses today include:  1. Viral illness   2. Acute nonintractable headache, unspecified headache type     Tests performed today include: Blood counts and electrolytes Urine test - no infection Coronavirus test - pending, should result tomorrow Vital signs. See below for your results today.   Medications:  In the Emergency Department you received: Reglan - antinausea/headache medication Benadryl - antihistamine to counteract potential side effects of reglan  Take any prescribed medications only as directed.  Additional information:  Follow any educational materials contained in this packet.  You are having a headache. No specific cause was found today for your headache. It may have been a migraine or other cause of headache. Stress, anxiety, fatigue, and depression are common triggers for headaches.   Your headache today does not appear to be life-threatening or require hospitalization, but often the exact cause of headaches is not determined in the emergency department. Therefore, follow-up with your doctor is very important to find out what may have caused your headache and whether or not you need any further diagnostic testing or treatment.   Sometimes headaches can appear benign (not harmful), but then more serious symptoms can develop which should prompt an immediate re-evaluation by your doctor or the emergency department.  BE VERY CAREFUL not to take multiple medicines containing Tylenol (also called acetaminophen). Doing so can lead to an overdose which can damage your liver and cause liver failure and  possibly death.   Follow-up instructions: Please follow-up with your primary care provider in the next 3 days for further evaluation of your symptoms.   Return instructions:  Please return to the Emergency Department if you experience worsening symptoms. Return if the medications do not resolve your headache, if it recurs, or if you have multiple episodes of vomiting or cannot keep down fluids. Return if you have a change from the usual headache. RETURN IMMEDIATELY IF you: Develop a sudden, severe headache Develop confusion or become poorly responsive or faint Develop a fever above 100.33F or problem breathing Have a change in speech, vision, swallowing, or understanding Develop new weakness, numbness, tingling, incoordination in your arms or legs Have a seizure Please return if you have any other emergent concerns.  Additional Information:  Your vital signs today were: BP (!) 144/95 (BP Location: Left Arm)    Pulse (!) 109    Temp 100.1 F (37.8 C) (Oral)    Resp 20    Ht 5\' 7"  (1.702 m)    Wt (!) 155 kg    LMP  (LMP Unknown)    SpO2 98%    BMI 53.52 kg/m  If your blood pressure (BP) was elevated above 135/85 this visit, please have this repeated by your doctor within one month. --------------

## 2019-09-09 NOTE — ED Notes (Signed)
Pt ambulated to the bathroom to void.  Pt continues to feel nauseated and fatigued

## 2019-09-09 NOTE — ED Triage Notes (Signed)
Pt reports headache last night, took ibuprofen without relief. C/o nausea, vomiting, abdominal pain, and body aches today

## 2019-09-09 NOTE — ED Provider Notes (Signed)
[facility] EMERGENCY DEPARTMENT Provider Note   CSN: [id number] Arrival date & time: 09/09/19  1941     History   Chief Complaint Chief Complaint  Patient presents with  . Emesis    HPI Kim James is a 28 y.o. female.     Patient with no significant past medical history presents the emergency department complaint of headache and development of fever and body aches today with continued headache.  Patient states that she developed a bandlike headache around her head last night.  She states that her headache started gradually.  She states that she thought that she was dehydrated and drank some water which made her head feel better.  Upon waking up today she was very fatigued.  She had generalized body aches.  She had nausea and vomiting.  No focal abdominal pain reported.  Upon arrival to the emergency department her temperature is 100.1 F.  Her headache continues today.  They are generalized.  No vision changes, confusion, or neck pain.  Patient has been taking ibuprofen without much improvement.  She denies any sick contacts or contacts with coronavirus.  No urinary symptoms or skin rashes.     Past Medical History:  Diagnosis Date  . Carpal tunnel syndrome   . Depression     There are no active problems to display for this patient.   History reviewed. No pertinent surgical history.   OB History   No obstetric history on file.      Home Medications    Prior to Admission medications   Medication Sig Start Date End Date Taking? Authorizing Provider  meclizine (ANTIVERT) 12.5 MG tablet Take 1 tablet (12.5 mg total) by mouth 3 (three) times daily as needed for dizziness. 03/19/15   Cartner, Marland Kitchen, PA-C  UNABLE TO FIND "hydroxycut"    [provider]    Family History No family history on file.  Social History Social History   Tobacco Use  . Smoking status: Never Smoker  . Smokeless tobacco: Never Used  Substance Use Topics  . Alcohol use:  Yes    Comment: occasional  . Drug use: No     Allergies   Other   Review of Systems Review of Systems  Constitutional: Positive for fatigue. Negative for fever.  HENT: Negative for rhinorrhea and sore throat.   Eyes: Negative for photophobia, redness and visual disturbance.  Respiratory: Negative for cough and shortness of breath.   Cardiovascular: Negative for chest pain.  Gastrointestinal: Positive for abdominal pain (mild, generalized), nausea and vomiting. Negative for diarrhea.  Genitourinary: Negative for dysuria.  Musculoskeletal: Positive for myalgias.  Skin: Negative for rash.  Neurological: Positive for headaches.     Physical Exam Updated Vital Signs BP (!) 144/95 (BP Location: Left Arm)   Pulse (!) 109   Temp 100.1 F (37.8 C) (Oral)   Resp 20   Ht 5\' 7"  (1.702 m)   Wt (!) 155 kg   LMP  (LMP Unknown)   SpO2 98%   BMI 53.52 kg/m   Physical Exam Vitals signs and nursing note reviewed.  Constitutional:      Appearance: She is well-developed.  HENT:     Head: Normocephalic and atraumatic.     Right Ear: Tympanic membrane, ear canal and external ear normal.     Left Ear: Tympanic membrane, ear canal and external ear normal.     Nose: Nose normal.     Mouth/Throat:     Mouth: Mucous membranes are moist.  Pharynx: Oropharynx is clear. Uvula midline. No oropharyngeal exudate or posterior oropharyngeal erythema.  Eyes:     General: Lids are normal.        Right eye: No discharge.        Left eye: No discharge.     Extraocular Movements:     Right eye: No nystagmus.     Left eye: No nystagmus.     Conjunctiva/sclera: Conjunctivae normal.     Pupils: Pupils are equal, round, and reactive to light.  Neck:     Musculoskeletal: Normal range of motion and neck supple.     Comments: Patient with full range of motion of the neck without any signs of meningismus. Cardiovascular:     Rate and Rhythm: Regular rhythm. Tachycardia present.     Heart sounds:  Normal heart sounds.  Pulmonary:     Effort: Pulmonary effort is normal.     Breath sounds: Normal breath sounds.  Abdominal:     Palpations: Abdomen is soft.     Tenderness: There is no abdominal tenderness. There is no guarding or rebound.     Comments: No abdominal tenderness on my exam.  Musculoskeletal:     Cervical back: She exhibits normal range of motion, no tenderness and no bony tenderness.  Skin:    General: Skin is warm and dry.  Neurological:     Mental Status: She is alert and oriented to person, place, and time.     GCS: GCS eye subscore is 4. GCS verbal subscore is 5. GCS motor subscore is 6.     Cranial Nerves: No cranial nerve deficit.     Sensory: No sensory deficit.     Coordination: Coordination normal.     Gait: Gait normal.     Deep Tendon Reflexes: Reflexes are normal and symmetric.      ED Treatments / Results  Labs (all labs ordered are listed, but only abnormal results are displayed) Labs Reviewed  URINALYSIS, ROUTINE W REFLEX MICROSCOPIC - Abnormal; Notable for the following components:      Result Value   Ketones, ur 40 (*)    All other components within normal limits  COMPREHENSIVE METABOLIC PANEL - Abnormal; Notable for the following components:   Sodium 134 (*)    Chloride 97 (*)    Glucose, Bld 140 (*)    Total Protein 8.4 (*)    All other components within normal limits  SARS CORONAVIRUS 2 (TAT 6-24 HRS)  PREGNANCY, URINE  CBC WITH DIFFERENTIAL/PLATELET    EKG None  Radiology No results found.  Procedures Procedures (including critical care time)  Medications Ordered in ED Medications  sodium chloride 0.9 % bolus 1,000 mL (1,000 mLs Intravenous New Bag/Given 09/09/19 2104)  metoCLOPramide (REGLAN) injection 10 mg (10 mg Intravenous Given 09/09/19 2114)  diphenhydrAMINE (BENADRYL) injection 25 mg (25 mg Intravenous Given 09/09/19 2113)  fentaNYL (SUBLIMAZE) injection 100 mcg (100 mcg Intravenous Given 09/09/19 2154)     Initial  Impression / Assessment and Plan / ED Course  I have reviewed the triage vital signs and the nursing notes.  Pertinent labs & imaging results that were available during my care of the patient were reviewed by me and considered in my medical decision making (see chart for details).        Patient seen and examined.  Urine test is normal.  Given patient's reported nausea, vomiting, abdominal pain, body aches, overall flulike illness, will check lab work.  We will also check coronavirus test  which will return tomorrow.  Will treat symptoms with IV fluids, Reglan, Benadryl.  Will reassess.  Overall she appears well, nontoxic.  No signs of meningitis on exam.  Vital signs reviewed and are as follows: BP (!) 144/95 (BP Location: Left Arm)   Pulse (!) 109   Temp 100.1 F (37.8 C) (Oral)   Resp 20   Ht 5\' 7"  (1.702 m)   Wt (!) 155 kg   LMP  (LMP Unknown)   SpO2 98%   BMI 53.52 kg/m   9:46 PM Patient continues with severe HA despite reglan/benadryl. States that her nausea is improved but she can't lie down to try to sleep 2/2 severe HA. She does not appear confused or in any distress.   Given complaint of severe headache, atypical for her, not improved with medications here, will order additional pain medication and CT imaging of the brain.  I still suspect that this is likely related to her ongoing infection.  Discussed lab results with patient.  9:56 PM Signout to Dr. Denton Lank at shift change. Will reassess and follow-up on head CT.   Final Clinical Impressions(s) / ED Diagnoses   Final diagnoses:  Viral illness  Acute nonintractable headache, unspecified headache type    ED Discharge Orders    None       Renne Crigler, Cordelia Poche 09/09/19 2156    Cathren Laine, MD 09/09/19 [phone]

## 2019-09-10 LAB — SARS CORONAVIRUS 2 (TAT 6-24 HRS): SARS Coronavirus 2: NEGATIVE

## 2019-10-02 ENCOUNTER — Other Ambulatory Visit: Payer: Self-pay

## 2019-10-02 DIAGNOSIS — Z20822 Contact with and (suspected) exposure to covid-19: Secondary | ICD-10-CM

## 2019-10-04 LAB — NOVEL CORONAVIRUS, NAA: SARS-CoV-2, NAA: NOT DETECTED

## 2019-11-28 ENCOUNTER — Emergency Department (HOSPITAL_COMMUNITY): Payer: BC Managed Care – PPO

## 2019-11-28 ENCOUNTER — Other Ambulatory Visit: Payer: Self-pay

## 2019-11-28 ENCOUNTER — Encounter (HOSPITAL_COMMUNITY): Payer: Self-pay | Admitting: *Deleted

## 2019-11-28 ENCOUNTER — Emergency Department (HOSPITAL_COMMUNITY)
Admission: EM | Admit: 2019-11-28 | Discharge: 2019-11-28 | Disposition: A | Discharge status: Home / Self Care | Payer: BC Managed Care – PPO | Attending: Emergency Medicine | Admitting: Emergency Medicine

## 2019-11-28 DIAGNOSIS — Z79899 Other long term (current) drug therapy: Secondary | ICD-10-CM | POA: Insufficient documentation

## 2019-11-28 DIAGNOSIS — Y939 Activity, unspecified: Secondary | ICD-10-CM | POA: Insufficient documentation

## 2019-11-28 DIAGNOSIS — Y999 Unspecified external cause status: Secondary | ICD-10-CM | POA: Insufficient documentation

## 2019-11-28 DIAGNOSIS — Y92410 Unspecified street and highway as the place of occurrence of the external cause: Secondary | ICD-10-CM | POA: Insufficient documentation

## 2019-11-28 DIAGNOSIS — S39012A Strain of muscle, fascia and tendon of lower back, initial encounter: Secondary | ICD-10-CM | POA: Insufficient documentation

## 2019-11-28 DIAGNOSIS — S3992XA Unspecified injury of lower back, initial encounter: Secondary | ICD-10-CM | POA: Diagnosis present

## 2019-11-28 LAB — I-STAT BETA HCG BLOOD, ED (MC, WL, AP ONLY): I-stat hCG, quantitative: <5 m[IU]/mL (ref <5)

## 2019-11-28 MED ORDER — METHOCARBAMOL 500 MG PO TABS: 500.0000 mg | ORAL_TABLET | Freq: Two times a day (BID) | ORAL | 0 refills | Status: AC

## 2019-11-28 MED ORDER — NAPROXEN 500 MG PO TABS: 500.0000 mg | ORAL_TABLET | Freq: Two times a day (BID) | ORAL | 0 refills | Status: AC
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
## 2019-11-28 NOTE — ED Notes (Signed)
Patient verbalizes understanding of discharge instructions. Opportunity for questioning and answers were provided. Armband removed by staff, pt discharged from ED.  
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
## 2020-10-13 ENCOUNTER — Other Ambulatory Visit: Payer: BC Managed Care – PPO

## 2020-10-13 DIAGNOSIS — Z20822 Contact with and (suspected) exposure to covid-19: Secondary | ICD-10-CM

## 2020-10-14 LAB — NOVEL CORONAVIRUS, NAA: SARS-CoV-2, NAA: NOT DETECTED

## 2020-10-14 LAB — SARS-COV-2, NAA 2 DAY TAT

## 2021-02-28 IMAGING — CT CT HEAD W/O CM
3 series · 15 of 47 positions shown, 18 images · non-contrast
Comparison: None.

CLINICAL DATA: Headache, acute severe

EXAM:
CT HEAD WITHOUT CONTRAST
TECHNIQUE: Contiguous axial images were obtained from the base of the skull
through the vertex without intravenous contrast.

[Series 2: head wo · axial · 0.43mm/px · z∈[-478,-354]mm · 9 of 30 slices shown, 12 images]
[im 3/30  brain]
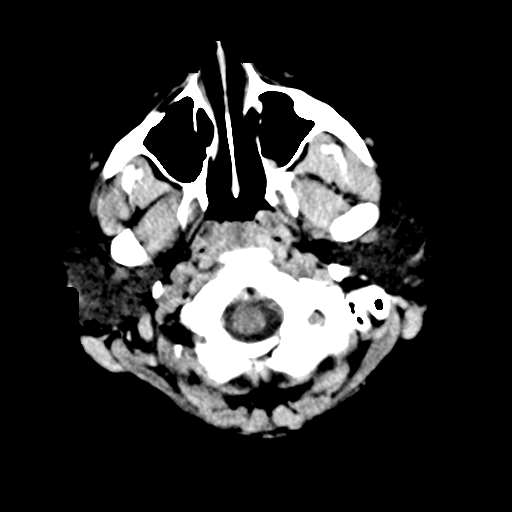
[im 3/30  bone]
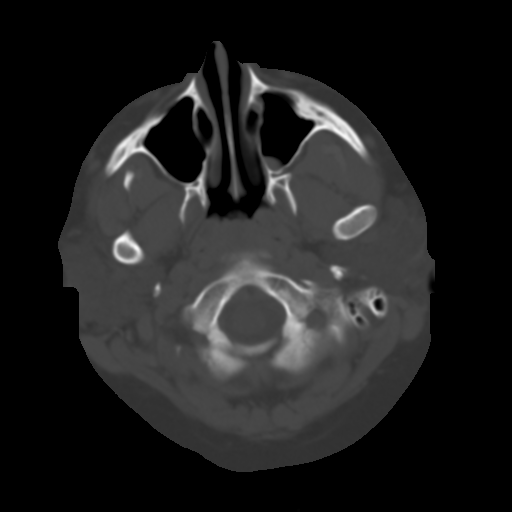
[im 6/30  brain]
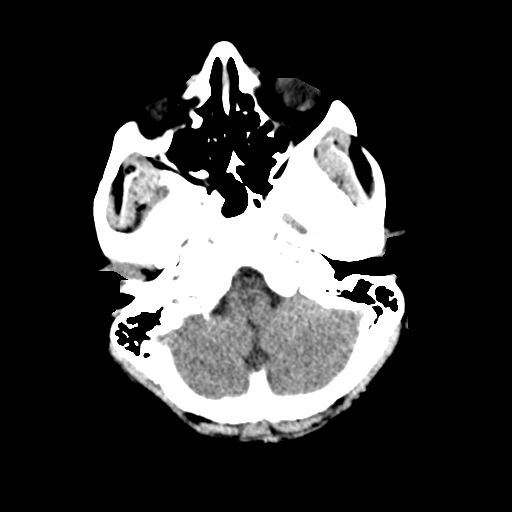
[im 9/30  brain]
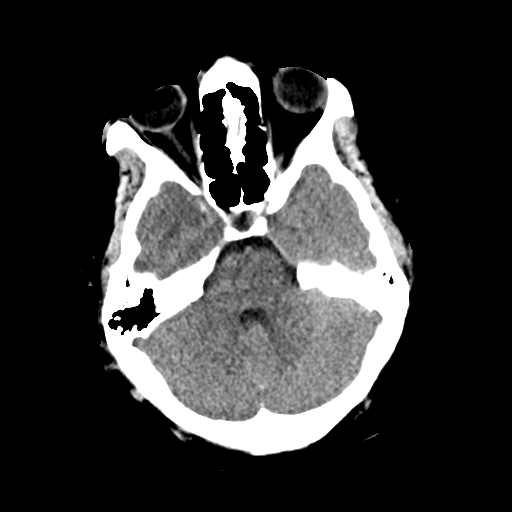
[im 12/30  brain]
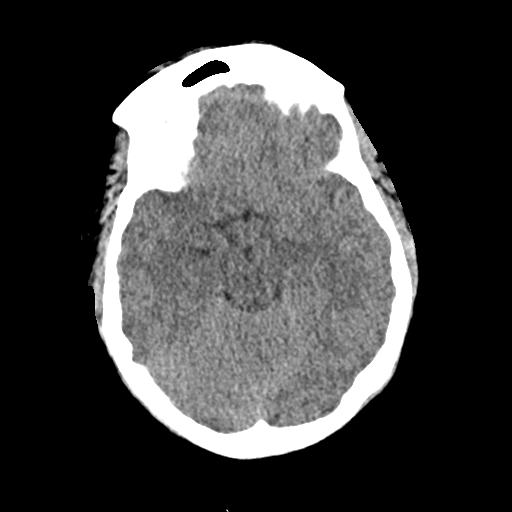
[im 16/30  brain]
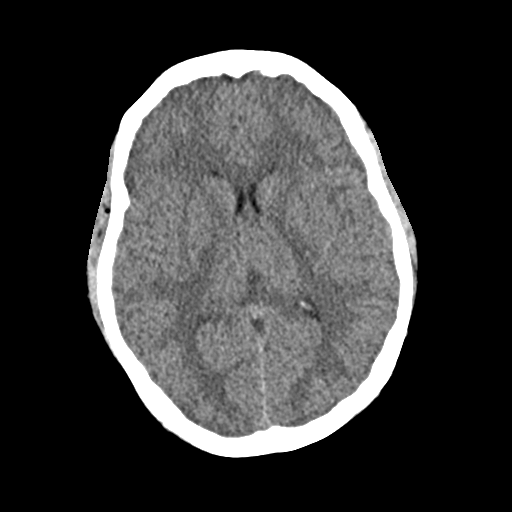
[im 16/30  bone]
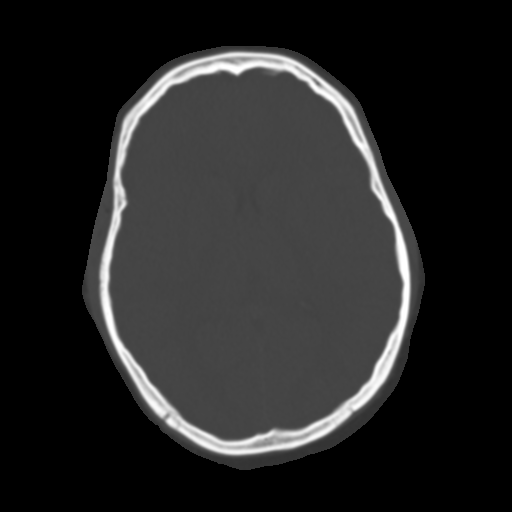
[im 19/30  brain]
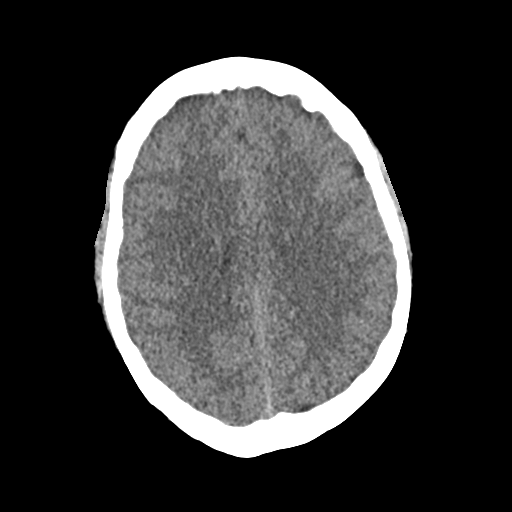
[im 22/30  brain]
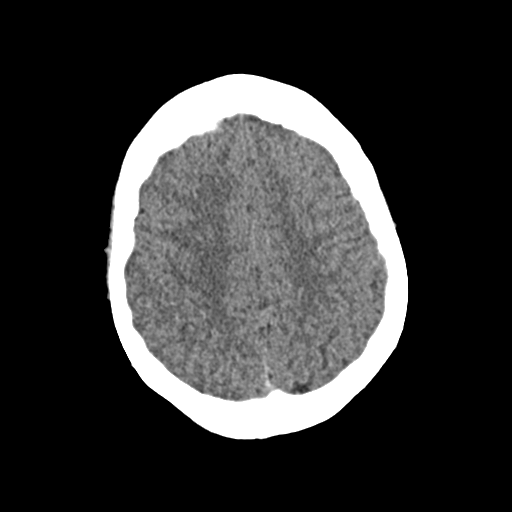
[im 25/30  brain]
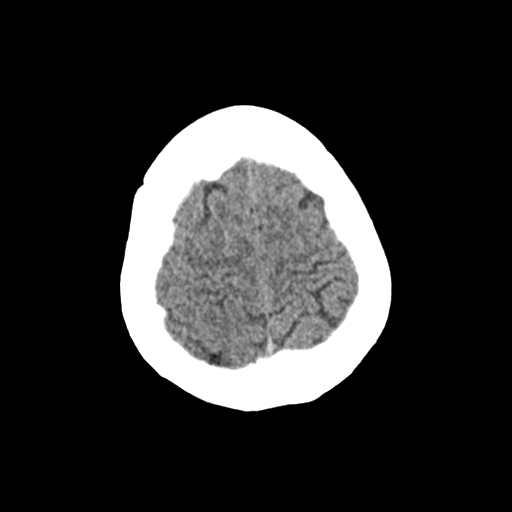
[im 28/30  brain]
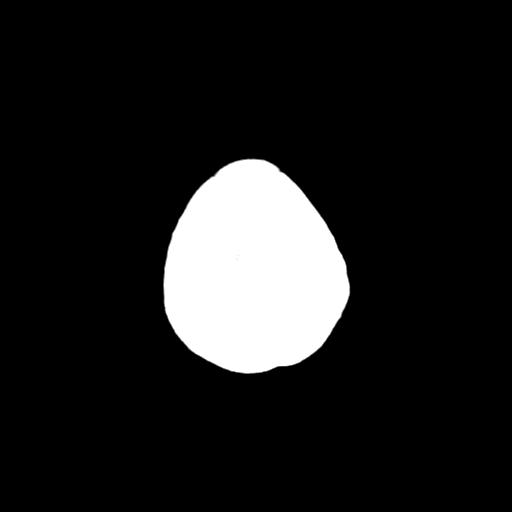
[im 28/30  bone]
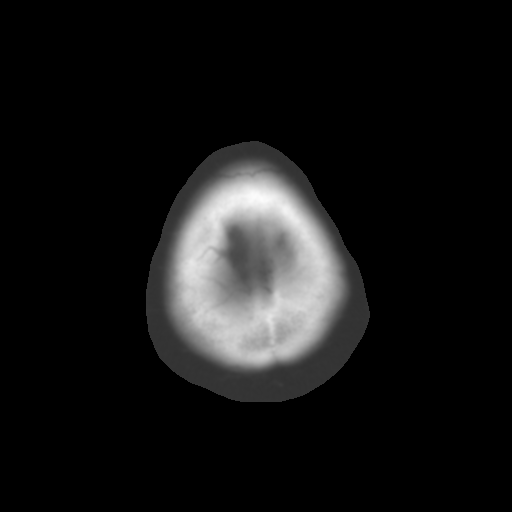

[Series 4: coronal soft · coronal · 0.29mm/px · 3 of 68 slices shown]
[im 23/68  brain]
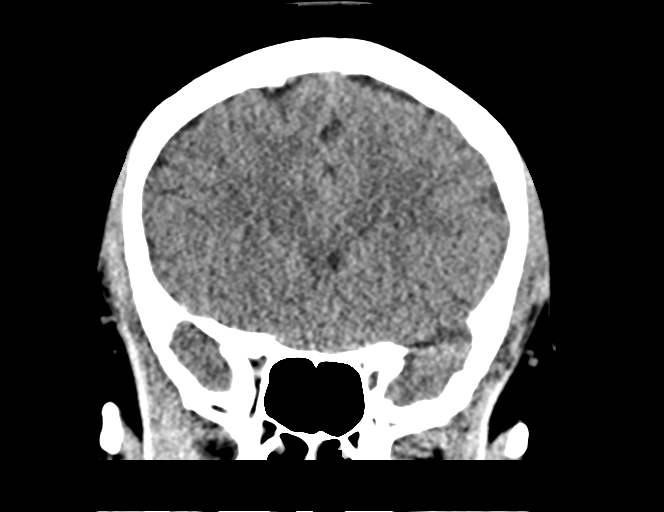
[im 30/68  brain]
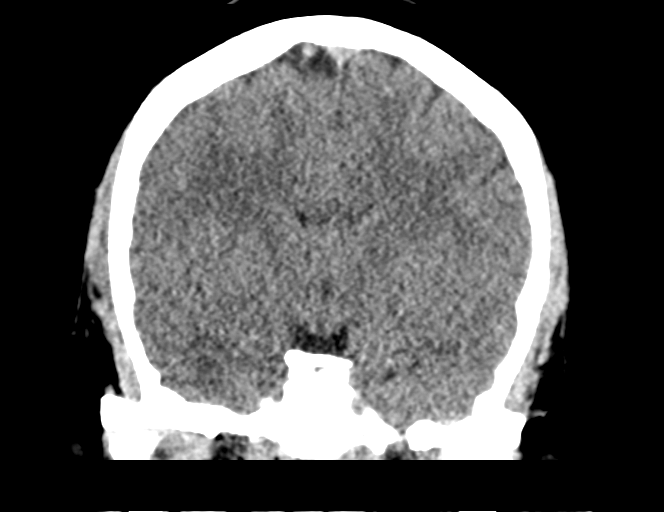
[im 38/68  brain]
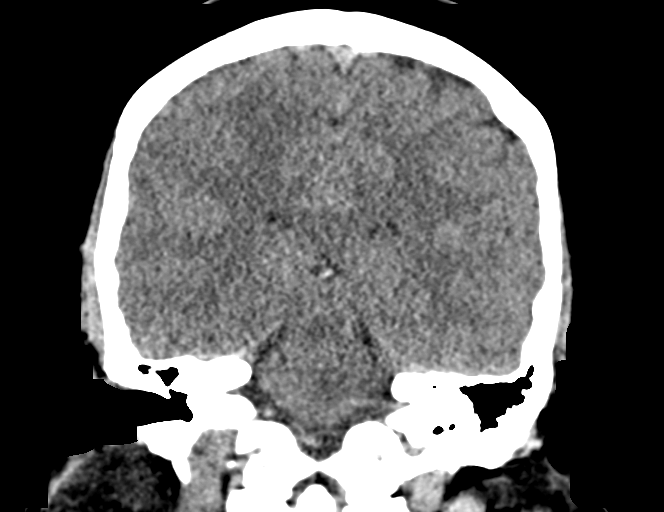

[Series 5: sag soft · sagittal · 0.29mm/px · 3 of 59 slices shown]
[im 20/59  brain]
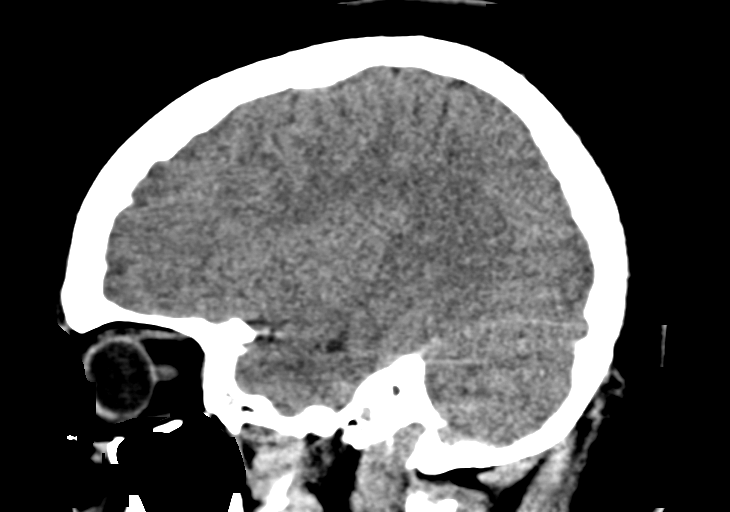
[im 30/59  brain]
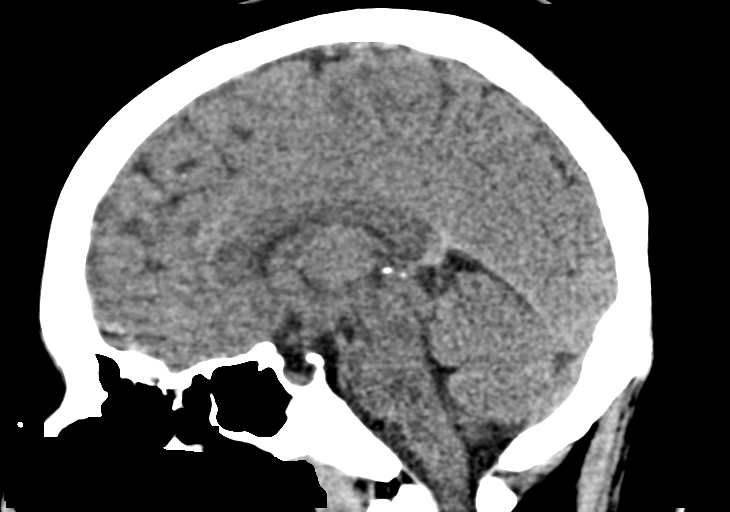
[im 39/59  brain]
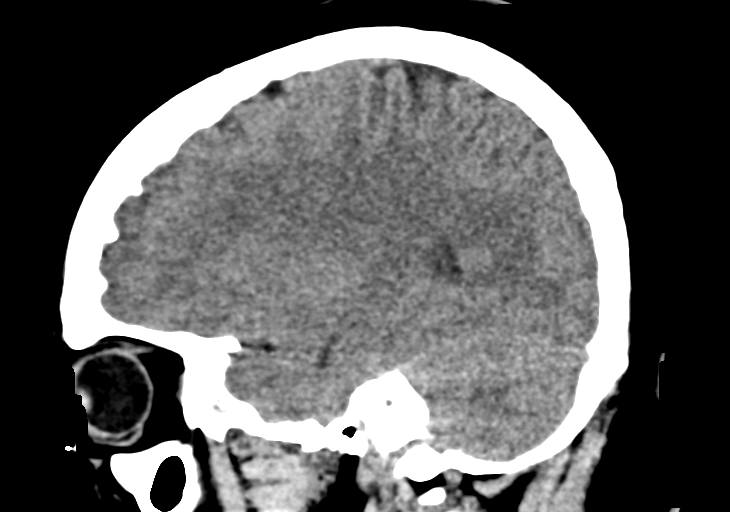

[15 of 47 positions shown; findings below may reference images not displayed]

FINDINGS: Brain: No evidence of acute territorial infarction, hemorrhage,
hydrocephalus,extra-axial collection or mass lesion/mass effect.
Normal gray-white differentiation. Ventricles are normal in size and
contour.

Vascular: No hyperdense vessel or unexpected calcification.

Skull: The skull is intact. No fracture or focal lesion identified.

Sinuses/Orbits: Small mucous retention cyst seen within the left
maxillary sinus. The orbits and globes intact.

Other: None
IMPRESSION: No acute intracranial abnormality.
